# Patient Record
Sex: Female | Born: 1995 | Race: White | Hispanic: No | Marital: Single | Smoking: Never smoker
Health system: Southern US, Community
[De-identification: ages and names within clinical notes are randomized; demographics above are authoritative.]

## PROBLEM LIST (undated history)

## (undated) HISTORY — PX: HIP SURGERY: SHX245

---
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
## 2017-01-13 ENCOUNTER — Ambulatory Visit (INDEPENDENT_AMBULATORY_CARE_PROVIDER_SITE_OTHER): Payer: BLUE CROSS/BLUE SHIELD | Admitting: Family Medicine

## 2017-01-13 ENCOUNTER — Encounter: Payer: Self-pay | Admitting: Family Medicine

## 2017-01-13 ENCOUNTER — Ambulatory Visit
Admission: RE | Admit: 2017-01-13 | Discharge: 2017-01-13 | Disposition: A | Discharge status: Home / Self Care | Payer: BLUE CROSS/BLUE SHIELD | Source: Ambulatory Visit | Attending: Family Medicine | Admitting: Family Medicine

## 2017-01-13 DIAGNOSIS — M25552 Pain in left hip: Secondary | ICD-10-CM | POA: Insufficient documentation

## 2017-01-13 MED ORDER — DICLOFENAC SODIUM 75 MG PO TBEC: 75.0000 mg | DELAYED_RELEASE_TABLET | Freq: Two times a day (BID) | ORAL | 0 refills | Status: AC

## 2017-02-02 ENCOUNTER — Other Ambulatory Visit: Payer: Self-pay | Admitting: Family Medicine

## 2017-02-02 NOTE — Progress Notes (Signed)
Patient presents today with symptoms of left hip pain. Patient states that she has had symptoms for the last 6 months or so intermittently. She states that lately over the past few weeks she has noticed some more consistent left hip pain. She believes is due to the volume of [employer] practice. She denies any significant pain at rest or during sleep. Most of her pain is with running with changing direction. 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 There are post-op notes from that time for me to review. She denies a feeling of grinding or snapping of the hip. She denies any lower back pain or any radicular symptoms into her leg. She denies any problems with her right hip.  ROS: Negative except mentioned above. Vitals as per Epic. GENERAL: NAD RESP: CTA B CARD: RRR MSK: Left hip - no tenderness to palpation appreciated, full range of motion, mild discomfort with internal rotation, negative Fulcrum test, negative Faber test, 5 out of 5 strength of lower extremities, normal gait, NV intact NEURO: CN II-XII grossly intact   A/P: Left hip pain with hx of previous labral repair several years ago - would like to get x-rays to see if there are any arthritic changes, will have patient follow up with Dr. Ardine Engiehl, asked patient to get post-op notes if possible for review, may want to consider steroid injection for diagnostic and therapeutic purposes, NSAIDs prescribed for now, avoid activity that causes symptoms, modification discussed with patient and trainer.

## 2017-02-03 ENCOUNTER — Other Ambulatory Visit: Payer: Self-pay | Admitting: Family Medicine

## 2017-02-03 DIAGNOSIS — G8929 Other chronic pain: Secondary | ICD-10-CM

## 2017-02-03 DIAGNOSIS — M25552 Pain in left hip: Principal | ICD-10-CM

## 2017-03-03 ENCOUNTER — Other Ambulatory Visit: Payer: BLUE CROSS/BLUE SHIELD

## 2017-03-03 ENCOUNTER — Ambulatory Visit
Admission: RE | Admit: 2017-03-03 | Discharge: 2017-03-03 | Disposition: A | Discharge status: Home / Self Care | Payer: BLUE CROSS/BLUE SHIELD | Source: Ambulatory Visit | Attending: Family Medicine | Admitting: Family Medicine

## 2017-03-03 DIAGNOSIS — G8929 Other chronic pain: Secondary | ICD-10-CM

## 2017-03-03 DIAGNOSIS — M25552 Pain in left hip: Principal | ICD-10-CM

## 2019-05-21 IMAGING — MR MR HIP*L* W/O CM
6 of 7 series · 34 of 40 positions shown · non-contrast
Comparison: None.

CLINICAL DATA: Left hip pain.  Prior labral repair 6 years ago.

EXAM:
MR OF THE LEFT HIP WITHOUT CONTRAST
TECHNIQUE: Multiplanar, multisequence MR imaging was performed. No intravenous
contrast was administered.

[Series 3: T1 · coronal · left · 3.5mm · 0.78mm/px · 8 of 35 slices shown]
[im 1/35]
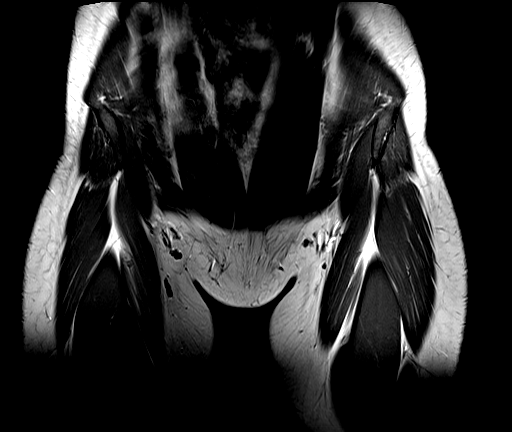
[im 5/35]
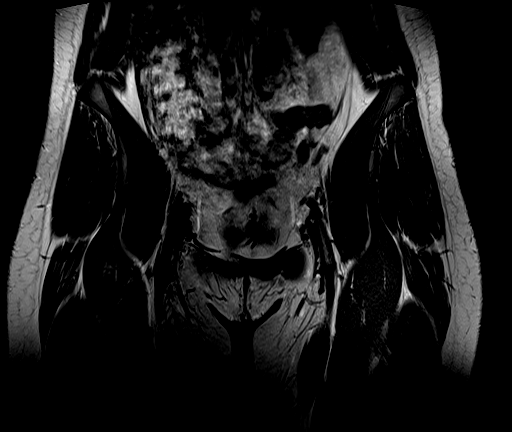
[im 10/35]
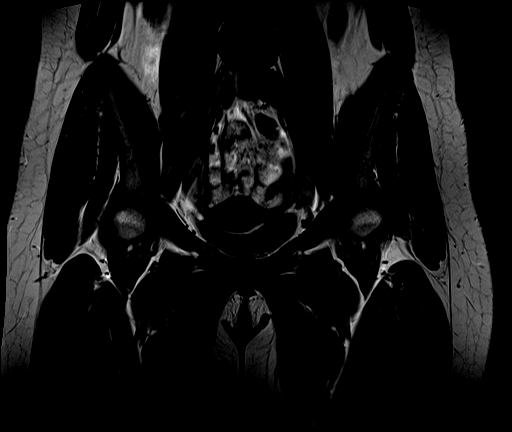
[im 15/35]
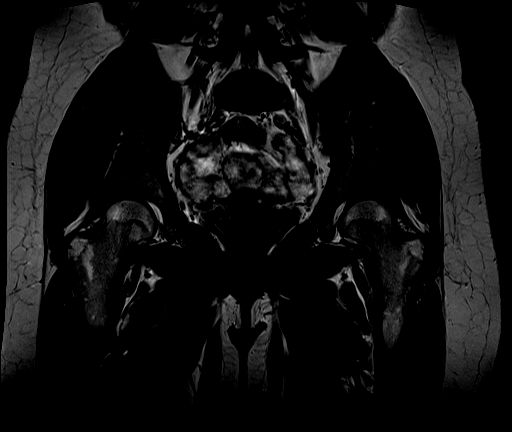
[im 20/35]
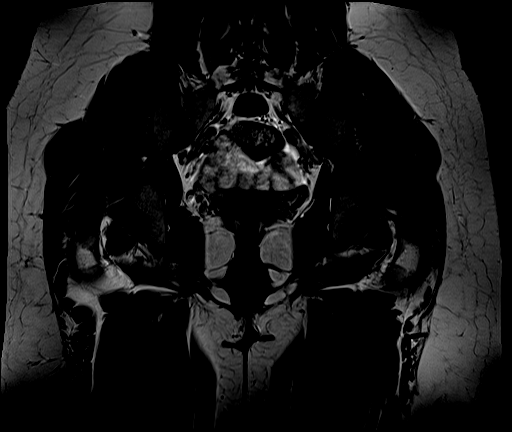
[im 25/35]
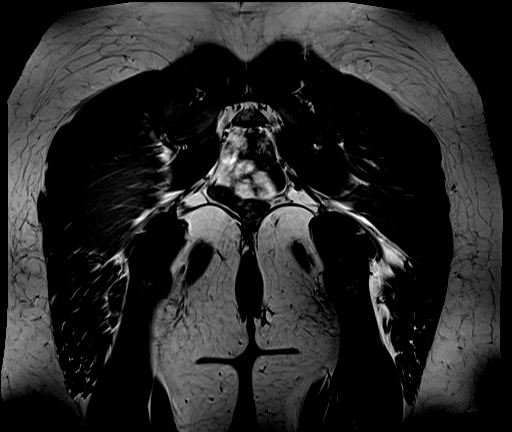
[im 30/35]
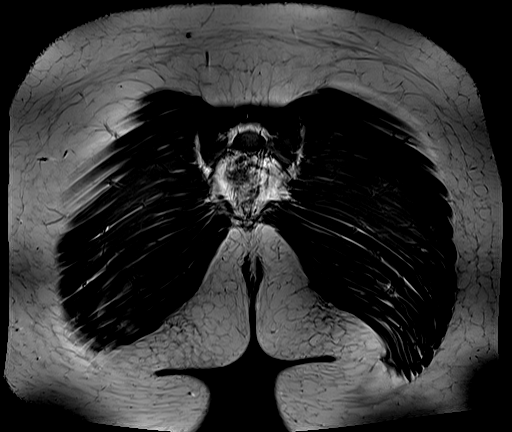
[im 35/35]
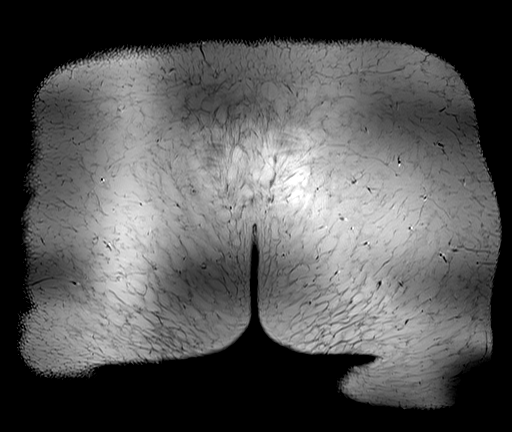

[Series 4: PD fat-sat · axial · left · 3.2mm · 0.56mm/px · z∈[-16,+70]mm · 6 of 31 slices shown (1 of 3)]
[im 1/31]
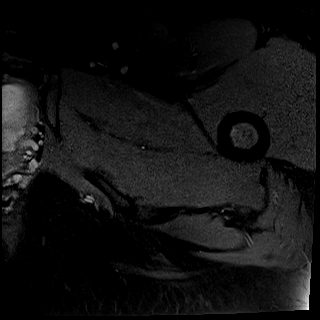
[im 7/31]
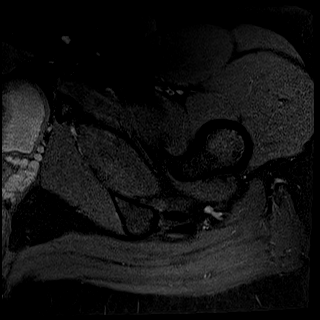
[im 13/31]
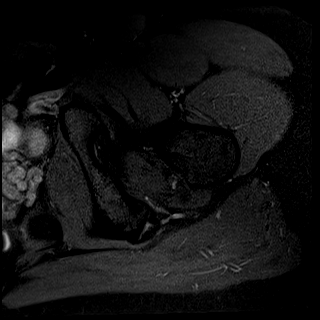
[im 19/31]
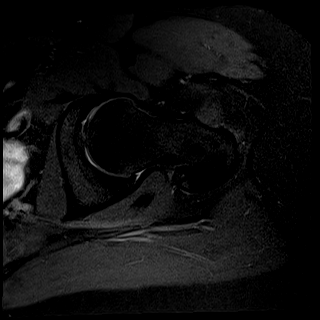
[im 25/31]
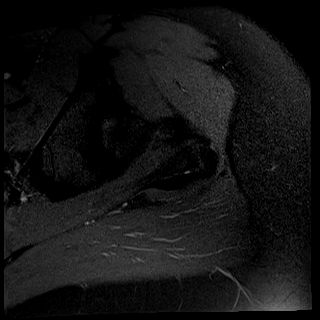
[im 31/31]
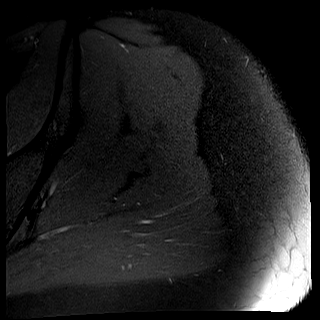

[Series 5: T2 fat-sat · coronal · left · 3.5mm · 0.89mm/px · 7 of 35 slices shown]
[im 1/35]
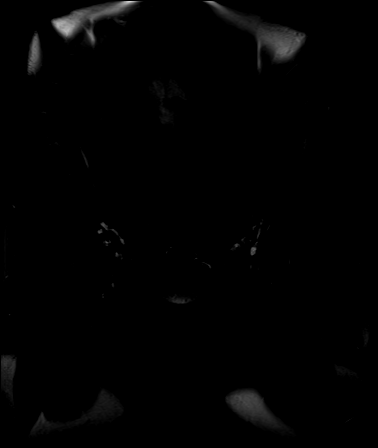
[im 6/35]
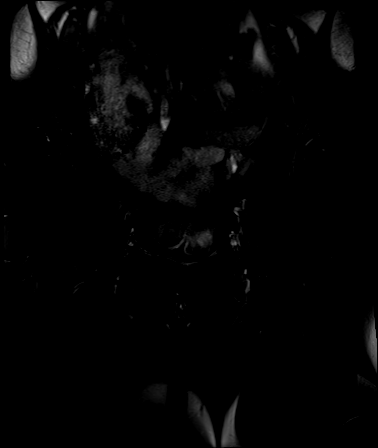
[im 12/35]
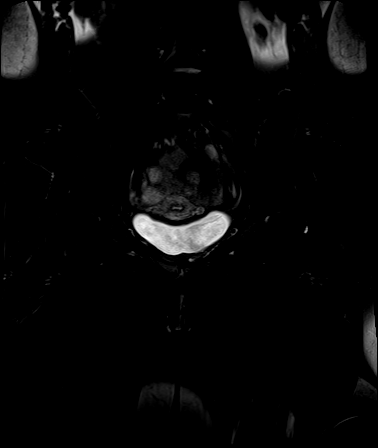
[im 18/35]
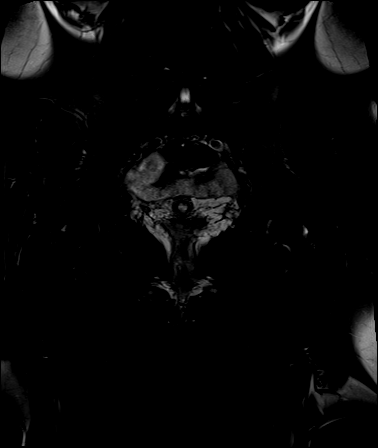
[im 23/35]
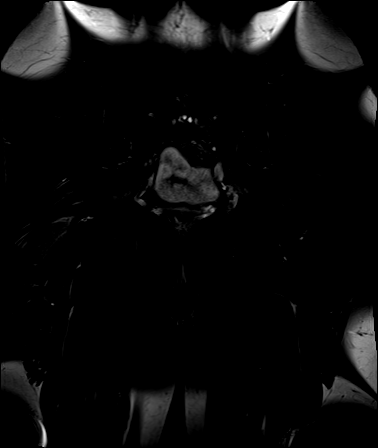
[im 29/35]
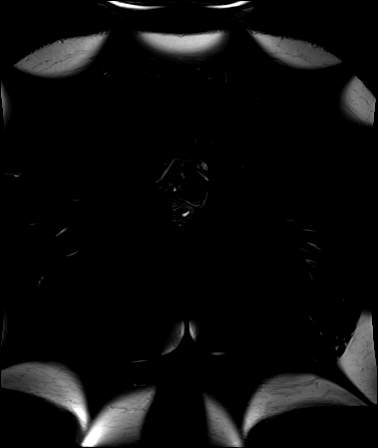
[im 35/35]
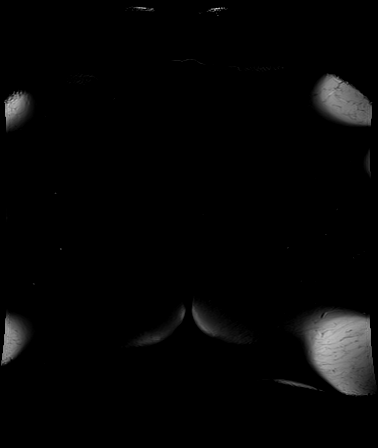

[Series 6: PD fat-sat · coronal · left · 3.2mm · 0.56mm/px · 4 of 20 slices shown (2 of 3)]
[im 1/20]
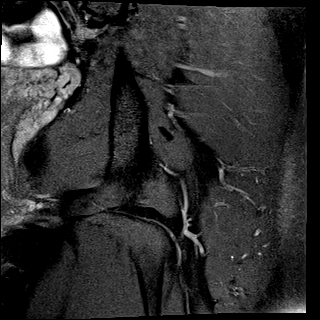
[im 7/20]
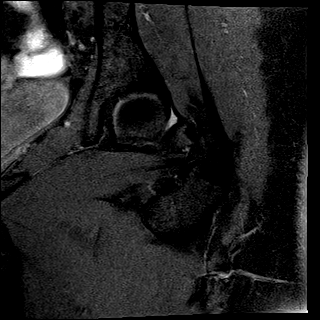
[im 13/20]
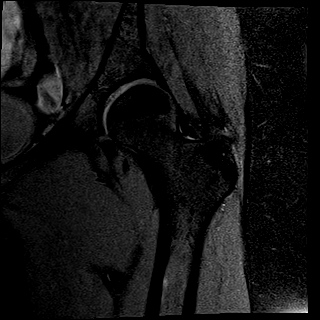
[im 20/20]
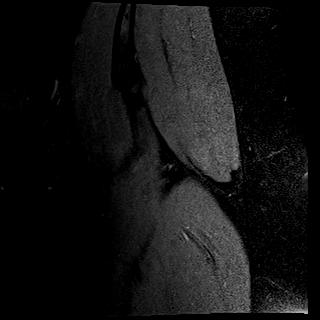

[Series 7: PD fat-sat · sagittal · left · 3.2mm · 0.56mm/px · 5 of 26 slices shown (3 of 3)]
[im 1/26]
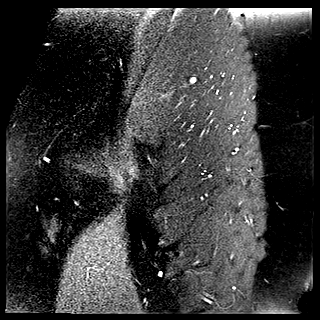
[im 7/26]
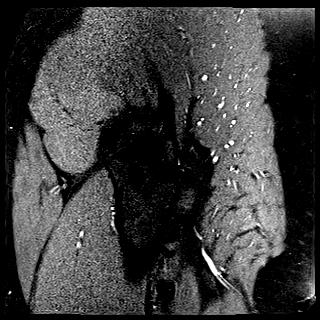
[im 13/26]
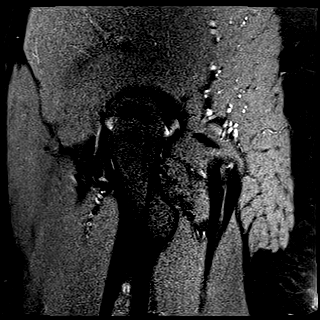
[im 19/26]
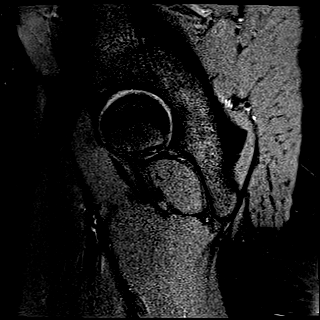
[im 26/26]
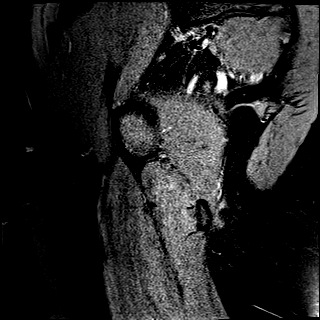

[Series 8: t2_haste_tra · axial · left · 4.0mm · 0.56mm/px · z∈[-73,+9]mm · 4 of 24 slices shown]
[im 1/24]
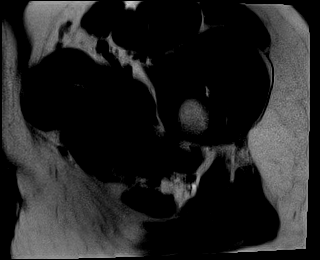
[im 6/24]
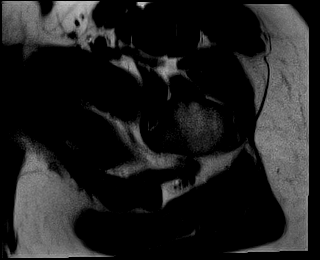
[im 12/24]
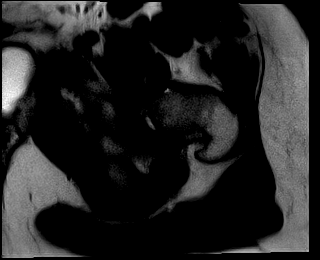
[im 18/24]
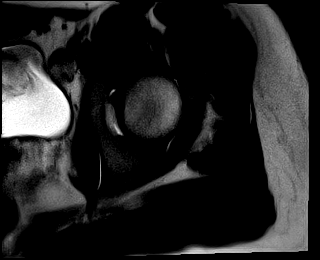

[34 of 40 positions shown; findings below may reference images not displayed]

FINDINGS: Bones: No hip fracture, dislocation or avascular necrosis. No
periosteal reaction or bone destruction. No aggressive osseous
lesion.

Irregularity on either side of the pubic symphysis with mild
increased T2 hyperintense signal and mild marrow edema in the right
pubic bone as can be seen with osteitis pubis.

Normal sacrum and sacroiliac joints. No SI joint widening or erosive
changes.

Articular cartilage and labrum

Articular cartilage:  No chondral defect.

Labrum: Attenuation of the superior anterior right labrum consistent
with prior labral surgery. No new discrete labral tear. Small T2
hyperintense focus in the superior anterior acetabulum adjacent to
the labrum likely reflecting a small intraosseous cyst. No
hypertrophic changes involving the acetabulum or femoral head neck
junction.

Joint or bursal effusion

Joint effusion:  No hip joint effusion.  No SI joint effusion.

Bursae:  No bursa formation.

Muscles and tendons

Flexors: Normal.

Extensors: Normal.

Abductors: Normal.

Adductors: Normal.

Gluteals: Normal.

Hamstrings: Normal.

Other findings

Miscellaneous: No pelvic free fluid. No fluid collection or
hematoma. No inguinal lymphadenopathy. No inguinal hernia.
IMPRESSION: 1. Attenuation of the superior anterior right labrum consistent with
prior labral surgery. No new discrete labral tear.
2. Irregularity on either side of the pubic symphysis with mild
increased T2 hyperintense signal and mild marrow edema in the right
pubic bone as can be seen with osteitis pubis.
